# Patient Record
Sex: Male | Born: 2005 | Race: White | Hispanic: No | Marital: Single | Smoking: Never smoker
Health system: Southern US, Community
[De-identification: ages and names within clinical notes are randomized; demographics above are authoritative.]

## PROBLEM LIST (undated history)

## (undated) DIAGNOSIS — F913 Oppositional defiant disorder: Secondary | ICD-10-CM

## (undated) DIAGNOSIS — R45851 Suicidal ideations: Secondary | ICD-10-CM

## (undated) DIAGNOSIS — F39 Unspecified mood [affective] disorder: Secondary | ICD-10-CM

## (undated) DIAGNOSIS — F819 Developmental disorder of scholastic skills, unspecified: Secondary | ICD-10-CM

## (undated) DIAGNOSIS — J45909 Unspecified asthma, uncomplicated: Secondary | ICD-10-CM

## (undated) DIAGNOSIS — F909 Attention-deficit hyperactivity disorder, unspecified type: Secondary | ICD-10-CM

## (undated) DIAGNOSIS — G4700 Insomnia, unspecified: Secondary | ICD-10-CM

## (undated) DIAGNOSIS — F332 Major depressive disorder, recurrent severe without psychotic features: Secondary | ICD-10-CM

## (undated) HISTORY — PX: COSMETIC SURGERY: SHX468

---
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
## 2023-08-02 ENCOUNTER — Ambulatory Visit: Payer: Medicaid Other | Admitting: Nurse Practitioner

## 2023-09-08 ENCOUNTER — Ambulatory Visit: Payer: Medicaid Other | Admitting: Nurse Practitioner

## 2023-10-17 ENCOUNTER — Other Ambulatory Visit: Payer: Self-pay

## 2023-10-17 ENCOUNTER — Encounter (HOSPITAL_COMMUNITY): Payer: Self-pay

## 2023-10-17 ENCOUNTER — Emergency Department (HOSPITAL_COMMUNITY)
Admission: EM | Admit: 2023-10-17 | Discharge: 2023-10-17 | Disposition: A | Discharge status: Home / Self Care | Attending: Emergency Medicine | Admitting: Emergency Medicine

## 2023-10-17 ENCOUNTER — Emergency Department (HOSPITAL_COMMUNITY)

## 2023-10-17 DIAGNOSIS — D72829 Elevated white blood cell count, unspecified: Secondary | ICD-10-CM | POA: Insufficient documentation

## 2023-10-17 DIAGNOSIS — R1031 Right lower quadrant pain: Secondary | ICD-10-CM | POA: Insufficient documentation

## 2023-10-17 DIAGNOSIS — J45909 Unspecified asthma, uncomplicated: Secondary | ICD-10-CM | POA: Diagnosis not present

## 2023-10-17 LAB — CBC
HCT: 50 % (ref 39.0–52.0)
Hemoglobin: 17.1 g/dL — ABNORMAL HIGH (ref 13.0–17.0)
MCH: 30.7 pg (ref 26.0–34.0)
MCHC: 34.2 g/dL (ref 30.0–36.0)
MCV: 89.8 fL (ref 80.0–100.0)
Platelets: 346 10*3/uL (ref 150–400)
RBC: 5.57 MIL/uL (ref 4.22–5.81)
RDW: 12.2 % (ref 11.5–15.5)
WBC: 17.4 10*3/uL — ABNORMAL HIGH (ref 4.0–10.5)
nRBC: 0 % (ref 0.0–0.2)

## 2023-10-17 LAB — COMPREHENSIVE METABOLIC PANEL WITH GFR
ALT: 29 U/L (ref 0–44)
AST: 25 U/L (ref 15–41)
Albumin: 4.6 g/dL (ref 3.5–5.0)
Alkaline Phosphatase: 55 U/L (ref 38–126)
Anion gap: 10 (ref 5–15)
BUN: 9 mg/dL (ref 6–20)
CO2: 24 mmol/L (ref 22–32)
Calcium: 9.6 mg/dL (ref 8.9–10.3)
Chloride: 103 mmol/L (ref 98–111)
Creatinine, Ser: 0.92 mg/dL (ref 0.61–1.24)
GFR, Estimated: >60 mL/min (ref >60)
Glucose, Bld: 115 mg/dL — ABNORMAL HIGH (ref 70–99)
Potassium: 4.2 mmol/L (ref 3.5–5.1)
Sodium: 137 mmol/L (ref 135–145)
Total Bilirubin: 1.1 mg/dL (ref 0.0–1.2)
Total Protein: 8.1 g/dL (ref 6.5–8.1)

## 2023-10-17 LAB — URINALYSIS, ROUTINE W REFLEX MICROSCOPIC
Bilirubin Urine: NEGATIVE
Glucose, UA: NEGATIVE mg/dL
Hgb urine dipstick: NEGATIVE
Ketones, ur: NEGATIVE mg/dL
Leukocytes,Ua: NEGATIVE
Nitrite: NEGATIVE
Protein, ur: NEGATIVE mg/dL
Specific Gravity, Urine: 1.009 (ref 1.005–1.030)
pH: 6 (ref 5.0–8.0)

## 2023-10-17 LAB — LIPASE, BLOOD: Lipase: 28 U/L (ref 11–51)

## 2023-10-17 MED ORDER — MORPHINE SULFATE (PF) 4 MG/ML IV SOLN
4.0000 mg | Freq: Once | INTRAVENOUS | Status: AC
  Administered 2023-10-17: 4 mg via INTRAVENOUS
  Filled 2023-10-17: qty 1

## 2023-10-17 MED ORDER — ONDANSETRON HCL 4 MG/2ML IJ SOLN: 4.0000 mg | Freq: Four times a day (QID) | INTRAMUSCULAR | Status: DC | PRN

## 2023-10-17 MED ORDER — IOHEXOL 350 MG/ML SOLN
75.0000 mL | Freq: Once | INTRAVENOUS | Status: AC | PRN
  Administered 2023-10-17: 75 mL via INTRAVENOUS

## 2023-10-17 MED ORDER — SODIUM CHLORIDE 0.9 % IV BOLUS
1000.0000 mL | Freq: Once | INTRAVENOUS | Status: AC
  Administered 2023-10-17: 1000 mL via INTRAVENOUS

## 2023-10-17 NOTE — Discharge Instructions (Signed)
 It was a pleasure caring for you today.  Please follow-up with your primary care provider.  Seek emergency care experiencing any new or worsening symptoms.

## 2023-10-17 NOTE — ED Notes (Signed)
 Patient transported to CT

## 2023-10-17 NOTE — ED Triage Notes (Signed)
 Pt c/o RLQ abdominal pain and nauseax2d.

## 2023-10-17 NOTE — ED Provider Notes (Addendum)
 [place] EMERGENCY DEPARTMENT AT [facility] Provider Note   CSN: [id number] Arrival date & time: 10/17/23  1703     History  Chief Complaint  Patient presents with   Abdominal Pain    Edward Fox is a 18 y.o. male with PMHx asthma who presents to ED concerned for nausea and RLQ pain x2 days. Patient also stating that he has a hx of low back pain d/t his job and heavy lifting and has had low back pain x2 weeks as well. Patient went to UC today who provided him with Advil and steroid shot and believed that the symptoms may have been d/t constipation. Patient stating that his back pain feels better now, but his RLQ pain persists. Last BM today.  Denies fever, vomiting, diarrhea, dysuria, hematuria, hematochezia.    Abdominal Pain      Home Medications Prior to Admission medications   Medication Sig Start Date End Date Taking? Authorizing Provider  albuterol (PROVENTIL HFA;VENTOLIN HFA) 108 (90 Base) MCG/ACT inhaler Inhale 2 puffs into the lungs every 6 (six) hours as needed for wheezing or shortness of breath. 07/29/15   Saez-Benito, Phillips Climes, MD  Amphetamine ER (ADZENYS XR-ODT) 18.8 MG TBED Take 1 tablet by mouth every morning. 02/24/16   [provider]  Amphetamine ER (ADZENYS XR-ODT) 6.3 MG TBED Take 1 tablet by mouth every morning. 02/24/16   [provider]  HYDROcodone-acetaminophen (NORCO/VICODIN) 5-325 MG tablet Take by mouth. 03/05/16   [provider]      Allergies    Patient has no known allergies.    Review of Systems   Review of Systems  Gastrointestinal:  Positive for abdominal pain.    Physical Exam Updated Vital Signs BP (!) 117/54   Pulse 68   Temp 98.3 F (36.8 C)   Resp 17   SpO2 99%  Physical Exam Vitals and nursing note reviewed.  Constitutional:      General: He is not in acute distress.    Appearance: He is not ill-appearing or toxic-appearing.  HENT:     Head: Normocephalic and atraumatic.      Mouth/Throat:     Mouth: Mucous membranes are moist.     Pharynx: No posterior oropharyngeal erythema.  Eyes:     General: No scleral icterus.       Right eye: No discharge.        Left eye: No discharge.     Conjunctiva/sclera: Conjunctivae normal.  Cardiovascular:     Rate and Rhythm: Normal rate and regular rhythm.     Pulses: Normal pulses.     Heart sounds: Normal heart sounds. No murmur heard. Pulmonary:     Effort: Pulmonary effort is normal. No respiratory distress.     Breath sounds: Normal breath sounds. No wheezing, rhonchi or rales.  Abdominal:     General: Abdomen is flat. Bowel sounds are normal. There is no distension.     Palpations: Abdomen is soft. There is no mass.     Tenderness: There is abdominal tenderness in the right lower quadrant.  Musculoskeletal:     Right lower leg: No edema.     Left lower leg: No edema.  Skin:    General: Skin is warm and dry.     Findings: No rash.  Neurological:     General: No focal deficit present.     Mental Status: He is alert and oriented to person, place, and time. Mental status is at baseline.  Psychiatric:  Mood and Affect: Mood normal.     ED Results / Procedures / Treatments   Labs (all labs ordered are listed, but only abnormal results are displayed) Labs Reviewed  COMPREHENSIVE METABOLIC PANEL WITH GFR - Abnormal; Notable for the following components:      Result Value   Glucose, Bld 115 (*)    All other components within normal limits  CBC - Abnormal; Notable for the following components:   WBC 17.4 (*)    Hemoglobin 17.1 (*)    All other components within normal limits  LIPASE, BLOOD  URINALYSIS, ROUTINE W REFLEX MICROSCOPIC    EKG None  Radiology CT ABDOMEN PELVIS W CONTRAST Result Date: 10/17/2023 CLINICAL DATA:  Right lower quadrant abdominal pain. EXAM: CT ABDOMEN AND PELVIS WITH CONTRAST TECHNIQUE: Multidetector CT imaging of the abdomen and pelvis was performed using the standard protocol  following bolus administration of intravenous contrast. RADIATION DOSE REDUCTION: This exam was performed according to the departmental dose-optimization program which includes automated exposure control, adjustment of the mA and/or kV according to patient size and/or use of iterative reconstruction technique. CONTRAST:  75mL OMNIPAQUE IOHEXOL 350 MG/ML SOLN COMPARISON:  None Available. FINDINGS: Lower chest: The lung bases are clear of acute process. No pleural effusion or pulmonary lesions. The heart is normal in size. No pericardial effusion. The distal esophagus and aorta are unremarkable. Hepatobiliary: No focal hepatic lesions or intrahepatic biliary dilatation. The gallbladder is normal. No common bile duct dilatation. Pancreas: Unremarkable. No pancreatic ductal dilatation or surrounding inflammatory changes. Spleen: Normal in size without focal abnormality. Adrenals/Urinary Tract: Adrenal glands and kidneys are unremarkable. No renal calculi, renal lesions or findings for pyelonephritis. The bladder is unremarkable. Stomach/Bowel: Stomach, duodenum, small bowel and colon are grossly normal without oral contrast. The colon is largely fluid-filled which is typically seen with diarrheal disease is. No wall thickening or obvious pericolonic inflammatory changes. The appendix is normal. Vascular/Lymphatic: No significant vascular findings are present. No enlarged abdominal or pelvic lymph nodes. Reproductive: The prostate gland and seminal vesicles are unremarkable. Other: No pelvic mass or adenopathy. No free pelvic fluid collections. No inguinal mass or adenopathy. No abdominal wall hernia or subcutaneous lesions. Musculoskeletal: No significant bony findings. IMPRESSION: 1. No acute abdominal/pelvic findings, mass lesions or adenopathy. 2. Normal appendix. 3. The colon mildly distended and largely fluid-filled which is typically seen with diarrheal diseases. Electronically Signed   By: Rudie Meyer M.D.    On: 10/17/2023 21:57    Procedures Procedures    Medications Ordered in ED Medications  ondansetron (ZOFRAN) injection 4 mg (has no administration in time range)  sodium chloride 0.9 % bolus 1,000 mL (0 mLs Intravenous Stopped 10/17/23 2033)  morphine (PF) 4 MG/ML injection 4 mg (4 mg Intravenous Given 10/17/23 1847)  iohexol (OMNIPAQUE) 350 MG/ML injection 75 mL (75 mLs Intravenous Contrast Given 10/17/23 1924)    ED Course/ Medical Decision Making/ A&P                                 Medical Decision Making Amount and/or Complexity of Data Reviewed Labs: ordered. Radiology: ordered.  Risk Prescription drug management.    This patient presents to the ED for concern of abdominal pain, this involves an extensive number of treatment options, and is a complaint that carries with it a high risk of complications and morbidity.  The differential diagnosis includes gastroenteritis, colitis, small bowel obstruction, appendicitis, cholecystitis, pancreatitis,  nephrolithiasis, UTI, pyleonephritis, testicular torsion.   Co morbidities that complicate the patient evaluation  Asthma, chronic back pain   Additional history obtained:  No PCP listed in chart.    Problem List / ED Course / Critical interventions / Medication management  Patient presented for RLQ abdominal pain.  Physical exam with mild RLQ tenderness to palpation.  Patient also tachycardic in the low 100s which resolved with IV fluids.  No fever.  Other vital stable.  Rest of physical exam reassuring. I Ordered, and personally interpreted labs.  CBC with leukocytosis at 17.4.  No anemia.  Lipase within normal limits.  UA without concern for infection.  CMP reassuring. I ordered CT abd/pelv which was not concerning or appendicitis. It was showing possible diarrheal illness.  It appears that patient is suffering from viral illness vs muscle strain. Shared all results with patient. Answered all questions. Patient ready to go  home. Staffed with Dr. Andria Meuse. I have reviewed the patients home medicines and have made adjustments as needed Patient afebrile with stable vitals.  Provided with return precautions.  Discharged in good condition.   Social Determinants of Health:  none        Final Clinical Impression(s) / ED Diagnoses Final diagnoses:  Right lower quadrant abdominal pain    Rx / DC Orders ED Discharge Orders     None         Margarita Rana 10/17/23 2205    Anders Simmonds T, DO 10/17/23 2347

## 2023-10-30 ENCOUNTER — Ambulatory Visit: Payer: Medicaid Other | Admitting: Nurse Practitioner

## 2023-11-14 ENCOUNTER — Encounter (HOSPITAL_COMMUNITY): Payer: Self-pay | Admitting: Physician Assistant

## 2023-11-14 ENCOUNTER — Ambulatory Visit (INDEPENDENT_AMBULATORY_CARE_PROVIDER_SITE_OTHER)

## 2023-11-14 ENCOUNTER — Ambulatory Visit (HOSPITAL_COMMUNITY)
Admission: EM | Admit: 2023-11-14 | Discharge: 2023-11-14 | Disposition: A | Discharge status: Home / Self Care | Attending: Physician Assistant | Admitting: Physician Assistant

## 2023-11-14 ENCOUNTER — Encounter (HOSPITAL_COMMUNITY): Payer: Self-pay

## 2023-11-14 DIAGNOSIS — M545 Low back pain, unspecified: Secondary | ICD-10-CM | POA: Insufficient documentation

## 2023-11-14 DIAGNOSIS — R42 Dizziness and giddiness: Secondary | ICD-10-CM | POA: Diagnosis present

## 2023-11-14 DIAGNOSIS — G44209 Tension-type headache, unspecified, not intractable: Secondary | ICD-10-CM | POA: Insufficient documentation

## 2023-11-14 DIAGNOSIS — R5383 Other fatigue: Secondary | ICD-10-CM | POA: Insufficient documentation

## 2023-11-14 DIAGNOSIS — M25551 Pain in right hip: Secondary | ICD-10-CM

## 2023-11-14 DIAGNOSIS — R109 Unspecified abdominal pain: Secondary | ICD-10-CM | POA: Diagnosis present

## 2023-11-14 LAB — CBC WITH DIFFERENTIAL/PLATELET
Abs Immature Granulocytes: 0.03 10*3/uL (ref 0.00–0.07)
Basophils Absolute: 0.1 10*3/uL (ref 0.0–0.1)
Basophils Relative: 1 %
Eosinophils Absolute: 0.3 10*3/uL (ref 0.0–0.5)
Eosinophils Relative: 3 %
HCT: 42.3 % (ref 39.0–52.0)
Hemoglobin: 14.8 g/dL (ref 13.0–17.0)
Immature Granulocytes: 0 %
Lymphocytes Relative: 20 %
Lymphs Abs: 1.7 10*3/uL (ref 0.7–4.0)
MCH: 30.8 pg (ref 26.0–34.0)
MCHC: 35 g/dL (ref 30.0–36.0)
MCV: 87.9 fL (ref 80.0–100.0)
Monocytes Absolute: 1 10*3/uL (ref 0.1–1.0)
Monocytes Relative: 12 %
Neutro Abs: 5.6 10*3/uL (ref 1.7–7.7)
Neutrophils Relative %: 64 %
Platelets: 302 10*3/uL (ref 150–400)
RBC: 4.81 MIL/uL (ref 4.22–5.81)
RDW: 12 % (ref 11.5–15.5)
WBC: 8.6 10*3/uL (ref 4.0–10.5)
nRBC: 0 % (ref 0.0–0.2)

## 2023-11-14 LAB — COMPREHENSIVE METABOLIC PANEL WITH GFR
ALT: 20 U/L (ref 0–44)
AST: 23 U/L (ref 15–41)
Albumin: 4.2 g/dL (ref 3.5–5.0)
Alkaline Phosphatase: 45 U/L (ref 38–126)
Anion gap: 10 (ref 5–15)
BUN: 9 mg/dL (ref 6–20)
CO2: 26 mmol/L (ref 22–32)
Calcium: 9.3 mg/dL (ref 8.9–10.3)
Chloride: 104 mmol/L (ref 98–111)
Creatinine, Ser: 0.85 mg/dL (ref 0.61–1.24)
GFR, Estimated: >60 mL/min (ref >60)
Glucose, Bld: 82 mg/dL (ref 70–99)
Potassium: 3.8 mmol/L (ref 3.5–5.1)
Sodium: 140 mmol/L (ref 135–145)
Total Bilirubin: 0.6 mg/dL (ref 0.0–1.2)
Total Protein: 7.1 g/dL (ref 6.5–8.1)

## 2023-11-14 LAB — POCT URINALYSIS DIP (MANUAL ENTRY)
Bilirubin, UA: NEGATIVE
Blood, UA: NEGATIVE
Glucose, UA: NEGATIVE mg/dL
Ketones, POC UA: NEGATIVE mg/dL
Leukocytes, UA: NEGATIVE
Nitrite, UA: NEGATIVE
Protein Ur, POC: NEGATIVE mg/dL
Spec Grav, UA: 1.02 (ref 1.010–1.025)
Urobilinogen, UA: 0.2 U/dL
pH, UA: 6.5 (ref 5.0–8.0)

## 2023-11-14 LAB — TSH: TSH: 1.837 u[IU]/mL (ref 0.350–4.500)

## 2023-11-14 LAB — POCT FASTING CBG KUC MANUAL ENTRY: POCT Glucose (KUC): 98 mg/dL (ref 70–99)

## 2023-11-14 MED ORDER — NAPROXEN 500 MG PO TABS: 500.0000 mg | ORAL_TABLET | Freq: Two times a day (BID) | ORAL | 0 refills | Status: AC

## 2023-11-14 MED ORDER — LIDOCAINE 5 % EX PTCH: 1.0000 | MEDICATED_PATCH | CUTANEOUS | 0 refills | Status: AC

## 2023-11-14 MED ORDER — MECLIZINE HCL 25 MG PO TABS: 25.0000 mg | ORAL_TABLET | Freq: Three times a day (TID) | ORAL | 0 refills | Status: AC | PRN

## 2023-11-14 MED ORDER — BACLOFEN 10 MG PO TABS: 10.0000 mg | ORAL_TABLET | Freq: Two times a day (BID) | ORAL | 0 refills | Status: AC | PRN

## 2023-11-14 NOTE — ED Triage Notes (Signed)
 Pt c/o facial/head pressure with dizziness, rt lower back, and RLQ pain for months. States has been seen by multiple doctors and no dx. States has been tx'd with steroids and for constipation in the past with no relief.

## 2023-11-14 NOTE — Discharge Instructions (Addendum)
 I do not see anything on your x-ray but we will contact you if the radiologist sees something.  Use lidocaine  patch during the day and then remove this at night.  Use only 1 patch for 24 hours.  Take Naprosyn  twice daily.  Do not take NSAIDs with this medication including aspirin, ibuprofen/Advil, naproxen /Aleve .  You can use Tylenol /acetaminophen  for additional pain relief.  Take baclofen up to twice a day.  This will make you sleepy so do not drive or drink alcohol while taking it.  Take meclizine  for dizziness.  Follow-up with sports medicine.  Use heat and gentle stretch.  If anything worsens please return for reevaluation.  And hoping that these medications will also up with your headache.  Make sure you are drinking plenty of fluid.  Make sure that you are eating a bland diet and drinking plenty fluids to help with your abdominal pain.  I will contact you if any of your blood work is abnormal.  If anything worsens you need to be seen immediately including abdominal pain, nausea, vomiting, blood in your stool, weakness in your legs, numbness or tingling around your thighs, going to the bathroom on your cell without noticing it, constant dizziness, weakness, trouble speaking need to go to the ER.

## 2023-11-14 NOTE — ED Provider Notes (Signed)
 [facility]    CSN: [id number] Arrival date & time: 11/14/23  1436      History   Chief Complaint Chief Complaint  Patient presents with   Dizziness    HPI Edward Fox is a 18 y.o. male.   Patient presents today with several concerns.  He reports a prolonged history (many months) of right lower back pain.  He reports that several months ago he had an episode where he had such severe pain that interfered with his ability to perform daily activities.  Recently he has not been quite as severe but he continues to have pain that is rated 6 on a 0-10 pain scale, described as a sharp, localized to his right lumbar spine and sacroiliac region, no alleviating factors identified.  He denies any previous injury or surgery involving his back.  He has tried multiple medications and been evaluated by multiple providers but all of these interventions have been ineffective.  He has not seen a specialist and has not yet follow-up with primary care.  He has been taking over-the-counter analgesics without improvement.  Denies any numbness or paresthesias in his leg, lower extremities, saddle anesthesia.  In addition he reports a prolonged history of lower abdominal discomfort that is intermittent in nature without identifiable trigger.  He has been worked up in the past and had negative CT scan.  Was told that it was constipation but he has been taking medication for this without improvement of symptoms.  He reports having 1-2 normal bowel movements per day.  Denies any associated melena, hematochezia.  Denies any dietary changes.  He has not tried any over-the-counter medication.  He has never seen a specialist denies any history of ulcerative colitis or Crohn's disease.  He also reports intermittent headaches that he describes as a band around his head.  Denies any significant congestion or cough.  He sometimes will have pain in his neck.  Denies formal diagnosis of primary headache disorder.  Denies  any recent head trauma.  Denies any recent lifestyle or dietary changes.  He denies any associated visual disturbance, dysarthria, nausea, vomiting.  He does report some dizziness that he describes as room spinning sensation and blurred vision particularly with moving his head.  He has been evaluated for this in the past and had negative workup including normal CBC and CMP.  He was given meclizine  but does not feel that this improved his symptoms.    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   Mood disorder (HCC)    ODD (oppositional defiant disorder) 07/22/2015   Suicidal ideation 07/22/2015    Patient Active Problem List   Diagnosis Date Noted   Severe episode of recurrent major depressive disorder, with psychotic features (HCC)    Attention deficit hyperactivity disorder (ADHD), combined type    Severe episode of recurrent major depressive disorder, without psychotic features (HCC)    Attention-deficit hyperactivity disorder, predominantly hyperactive type    MDD (major depressive disorder), recurrent episode, severe (HCC) 08/17/2015   Attention deficit hyperactivity disorder (ADHD) 07/22/2015   MDD (major depressive disorder), recurrent severe, 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 Authorizing Provider  baclofen (LIORESAL) 10 MG tablet Take 1  tablet (10 mg total) by mouth 2 (two) times daily as needed for muscle spasms. 11/14/23  Yes Justene Jensen K, PA-C  lidocaine  (LIDODERM ) 5 % Place 1 patch onto the skin daily. Remove & Discard patch within 12 hours or as directed by MD 11/14/23  Yes Marley Pakula K, PA-C  meclizine  (ANTIVERT ) 25 MG tablet Take 1 tablet (25 mg total) by mouth 3 (three) times daily as needed for dizziness. 11/14/23  Yes Elgin Carn, Betsey Brow, PA-C  naproxen  (NAPROSYN ) 500 MG tablet Take 1 tablet (500 mg total) by mouth 2 (two) times daily. 11/14/23  Yes Osie Merkin K, PA-C  albuterol  (PROVENTIL  HFA;VENTOLIN  HFA) 108 (90 Base) MCG/ACT inhaler Inhale 2 puffs into the lungs every 6 (six) hours as needed for wheezing or shortness of breath. 07/29/15   Saez-Benito, Sherian Dimitri, MD  Amphetamine ER (ADZENYS XR-ODT) 18.8 MG TBED Take 1 tablet by mouth every morning. 02/24/16   [provider]  Amphetamine ER (ADZENYS XR-ODT) 6.3 MG TBED Take 1 tablet by mouth every morning. 02/24/16   [provider]    Family History History reviewed. No pertinent family history.  Social History Social History   Tobacco Use   Smoking status: Never   Smokeless tobacco: Never  Substance Use Topics   Alcohol use: No   Drug use: No     Allergies   Patient has no known allergies.   Review of Systems Review of Systems  Constitutional:  Positive for activity change and fatigue. Negative for appetite change and fever.  HENT:  Negative for congestion, sinus pressure, sneezing and sore throat.   Respiratory:  Negative for cough and shortness of breath.   Cardiovascular:  Negative for chest pain.  Gastrointestinal:  Positive for abdominal pain. Negative for diarrhea, nausea and vomiting.  Musculoskeletal:  Positive for arthralgias, back pain and neck pain. Negative for myalgias.  Neurological:  Positive for dizziness and headaches. Negative for light-headedness.     Physical Exam Triage Vital Signs ED Triage Vitals   Encounter Vitals Group     BP 11/14/23 1506 (!) 144/75     Systolic BP Percentile --      Diastolic BP Percentile --      Pulse Rate 11/14/23 1506 81     Resp 11/14/23 1506 16     Temp 11/14/23 1506 98.2 F (36.8 C)     Temp Source 11/14/23 1506 Oral     SpO2 11/14/23 1506 98 %     Weight --      Height --      Head Circumference --      Peak Flow --      Pain Score 11/14/23 1508 6     Pain Loc --      Pain Education --      Exclude from Growth Chart --    No data found.  Updated Vital Signs BP (!) 144/75 (BP Location: Left Arm)   Pulse 81   Temp 98.2 F (36.8 C) (Oral)   Resp 16   SpO2 98%   Visual Acuity Right Eye Distance:   Left Eye Distance:   Bilateral Distance:  Right Eye Near:   Left Eye Near:    Bilateral Near:     Physical Exam Vitals reviewed.  Constitutional:      General: He is awake.     Appearance: Normal appearance. He is well-developed. He is not ill-appearing.     Comments: Very pleasant male appears stated age in no acute distress sitting comfortably in exam room  HENT:     Head: Normocephalic and atraumatic.     Right Ear: Tympanic membrane, ear canal and external ear normal. Tympanic membrane is not erythematous or bulging.     Left Ear: Tympanic membrane, ear canal and external ear normal. Tympanic membrane is not erythematous or bulging.     Nose: Nose normal.     Mouth/Throat:     Pharynx: Uvula midline. Posterior oropharyngeal erythema present. No oropharyngeal exudate or uvula swelling.  Eyes:     Extraocular Movements: Extraocular movements intact.     Pupils: Pupils are equal, round, and reactive to light.  Cardiovascular:     Rate and Rhythm: Normal rate and regular rhythm.     Heart sounds: Normal heart sounds, S1 normal and S2 normal. No murmur heard. Pulmonary:     Effort: Pulmonary effort is normal. No accessory muscle usage or respiratory distress.     Breath sounds: Normal breath sounds. No stridor. No wheezing, rhonchi  or rales.     Comments: Clear auscultation bilaterally Abdominal:     General: Bowel sounds are normal.     Palpations: Abdomen is soft.     Tenderness: There is no abdominal tenderness. There is no right CVA tenderness, left CVA tenderness, guarding or rebound.     Comments: Benign abdominal exam.  No tenderness palpation on exam.  Musculoskeletal:     Cervical back: Normal range of motion and neck supple. No tenderness or bony tenderness.     Thoracic back: No tenderness or bony tenderness.     Lumbar back: Tenderness and bony tenderness present. No spasms. Negative right straight leg raise test and negative left straight leg raise test.     Comments: Back: Pain on percussion of lumbar vertebrae.  Tenderness palpation over SI joint.  Positive Faber on right.  Negative straight leg raise bilaterally.  Neurological:     General: No focal deficit present.     Mental Status: He is alert and oriented to person, place, and time.     Cranial Nerves: Cranial nerves 2-12 are intact.     Motor: Motor function is intact.     Coordination: Coordination is intact.  Psychiatric:        Behavior: Behavior is cooperative.      UC Treatments / Results  Labs (all labs ordered are listed, but only abnormal results are displayed) Labs Reviewed  POCT FASTING CBG [facility] MANUAL ENTRY - Normal  CBC WITH DIFFERENTIAL/PLATELET  COMPREHENSIVE METABOLIC PANEL WITH GFR  TSH  POCT URINALYSIS DIP (MANUAL ENTRY)    EKG   Radiology DG Hip Unilat With Pelvis 2-3 Views Right Result Date: 11/14/2023 CLINICAL DATA:  Hip pain. EXAM: DG HIP (WITH OR WITHOUT PELVIS) 2-3V RIGHT COMPARISON:  CT abdomen and pelvis 10/17/2023 FINDINGS: Normal bone mineralization. The bilateral sacroiliac, bilateral femoroacetabular, and pubic symphysis joint spaces are maintained. Normal morphology of the right femoral head-neck junction without CAM-type bump deformity. No acute fracture or dislocation. IMPRESSION: Normal right hip  radiographs. Electronically Signed   By: Bertina Broccoli M.D.   On: 11/14/2023 17:04   DG Lumbar Spine  Complete Result Date: 11/14/2023 CLINICAL DATA:  Facial/head pressure with dizziness. Right lower back pain. Right lower quadrant pain for months. EXAM: LUMBAR SPINE - COMPLETE 4+ VIEW COMPARISON:  CT abdomen and pelvis 10/17/2023 FINDINGS: The first non rib-bearing vertebral bodies considered L1. Normal frontal alignment. There is 3 mm retrolisthesis of L5 on S1, unchanged from 10/17/2023 CT. Vertebral body heights are maintained. Disc spaces are preserved. Mild to moderate L5-S1 facet joint sclerosis and hypertrophy. IMPRESSION: 1. 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 2. There is 3 mm retrolisthesis of L5 on S1, unchanged from 10/17/2023 CT. Note is made on 10/17/2023 CT of a left paracentral posterior disc protrusion at L5-S1 that contacts the descending left S1 nerve. Electronically Signed   By: Bertina Broccoli M.D.   On: 11/14/2023 17:02    Procedures Procedures (including critical care time)  Medications Ordered in UC Medications - No data to display  Initial Impression / Assessment and Plan / UC Course  I have reviewed the triage vital signs and the nursing notes.  Pertinent labs & imaging results that were available during my care of the patient were reviewed by me and considered in my medical decision making (see chart for details).     Patient is afebrile, nontoxic, nontachycardic.  X-rays of lumbar spine and hip were obtained that showed degenerative changes without acute osseous abnormality.  He was started on baclofen as well as lidocaine  patches for symptomatic relief.  We discussed that baclofen can be sedating and he is not to drive or drink alcohol with taking it.  He is to apply lidocaine  patch during the day and then remove this at night.  He can use over-the-counter analgesics such as Tylenol  for pain relief and was also started on Naprosyn .  We discussed that he is  not to take additional NSAIDs with this medication and risk of GI bleeding.  He is to avoid strenuous activity including heavy lifting.  Discussed that he should follow-up with the sports medicine specialist and was given the contact information for local provider with instruction to call to schedule an appointment.  Vital signs and physical exam are reassuring with no indication for emergent evaluation or imaging.  He is not going experiencing any abdominal pain.  He was encouraged to eat a bland diet and drink plenty of fluid.  Discussed that if he develops any severe symptoms including fever, focal abdominal pain, changes in his bowel habits, nausea/vomiting, melena or hematochezia he needs to be seen emergently.  Ongoing headaches consistent with primary headache disorder of tension headaches.  We discussed that medication prescribed to help with his musculoskeletal back pain should help to manage the symptoms as well.  He was encouraged to push fluids.  We did discuss that it is possible he can have rebound headaches and so should avoid regular use of analgesics as this could cause worsening of symptoms.  Discussed the importance of follow-up with primary care and he does attempt to schedule an appointment with them.  We discussed that if he has any worsening symptoms including severe headache, worst headache of his life, weakness he needs to be seen immediately.  Dizziness most consistent with vertigo.  He was provided a prescription for meclizine  to help manage the symptoms and also encouraged to make sure he is pushing fluids.  His blood sugar was appropriate.  Discussed that is important he follows up with primary care as they can refer him to a specialist if his symptoms persist for further  evaluation and management.  Discussed that if anything worsens he should go to the ER.  Patient reports ongoing fatigue and malaise.  His random glucose was normal.  UA was normal.  CBC, CMP, thyroid were  normal.  Unclear etiology of symptoms but he was encouraged to rest and follow appropriate sleep hygiene practices.  Recommended follow-up with primary care for additional evaluation.  Final Clinical Impressions(s) / UC Diagnoses   Final diagnoses:  Acute right-sided low back pain without sciatica  Right hip pain  Intermittent abdominal pain  Tension headache  Fatigue, unspecified type  Dizziness     Discharge Instructions      I do not see anything on your x-ray but we will contact you if the radiologist sees something.  Use lidocaine  patch during the day and then remove this at night.  Use only 1 patch for 24 hours.  Take Naprosyn  twice daily.  Do not take NSAIDs with this medication including aspirin, ibuprofen/Advil, naproxen /Aleve .  You can use Tylenol /acetaminophen  for additional pain relief.  Take baclofen up to twice a day.  This will make you sleepy so do not drive or drink alcohol while taking it.  Take meclizine  for dizziness.  Follow-up with sports medicine.  Use heat and gentle stretch.  If anything worsens please return for reevaluation.  And hoping that these medications will also up with your headache.  Make sure you are drinking plenty of fluid.  Make sure that you are eating a bland diet and drinking plenty fluids to help with your abdominal pain.  I will contact you if any of your blood work is abnormal.  If anything worsens you need to be seen immediately including abdominal pain, nausea, vomiting, blood in your stool, weakness in your legs, numbness or tingling around your thighs, going to the bathroom on your cell without noticing it, constant dizziness, weakness, trouble speaking need to go to the ER.     ED Prescriptions     Medication Sig Dispense Auth. Provider   baclofen (LIORESAL) 10 MG tablet Take 1 tablet (10 mg total) by mouth 2 (two) times daily as needed for muscle spasms. 30 each Linet Brash K, PA-C   lidocaine  (LIDODERM ) 5 % Place 1 patch onto the skin  daily. Remove & Discard patch within 12 hours or as directed by MD 30 patch Ivry Pigue K, PA-C   naproxen  (NAPROSYN ) 500 MG tablet Take 1 tablet (500 mg total) by mouth 2 (two) times daily. 30 tablet Salihah Peckham K, PA-C   meclizine  (ANTIVERT ) 25 MG tablet Take 1 tablet (25 mg total) by mouth 3 (three) times daily as needed for dizziness. 30 tablet Yael Coppess K, PA-C      PDMP not reviewed this encounter.   Budd Cargo, PA-C 11/14/23 2201

## 2024-01-17 ENCOUNTER — Ambulatory Visit (HOSPITAL_COMMUNITY)
Admission: EM | Admit: 2024-01-17 | Discharge: 2024-01-17 | Disposition: A | Discharge status: Home / Self Care | Attending: Internal Medicine | Admitting: Internal Medicine

## 2024-01-17 ENCOUNTER — Encounter (HOSPITAL_COMMUNITY): Payer: Self-pay

## 2024-01-17 DIAGNOSIS — K4090 Unilateral inguinal hernia, without obstruction or gangrene, not specified as recurrent: Secondary | ICD-10-CM

## 2024-01-17 LAB — POCT URINALYSIS DIP (MANUAL ENTRY)
Bilirubin, UA: NEGATIVE
Blood, UA: NEGATIVE
Glucose, UA: NEGATIVE mg/dL
Ketones, POC UA: NEGATIVE mg/dL
Leukocytes, UA: NEGATIVE
Nitrite, UA: NEGATIVE
Protein Ur, POC: NEGATIVE mg/dL
Spec Grav, UA: 1.025 (ref 1.010–1.025)
Urobilinogen, UA: 1 U/dL
pH, UA: 5.5 (ref 5.0–8.0)

## 2024-01-17 NOTE — ED Triage Notes (Signed)
 Lower abdominal pain started yesterday. Patient does lift heavy equipment. Patient denies any vomiting or diarrhea at this time.

## 2024-01-17 NOTE — ED Provider Notes (Signed)
 [facility]    CSN: [id number] Arrival date & time: 01/17/24  0803      History   Chief Complaint No chief complaint on file.   HPI Edward Fox is a 18 y.o. male.   Edward Fox is a 18 y.o. male presenting for chief complaint of generalized abdominal pain that started yesterday. Pain is localized to the generalized mid abdomen and intermittently radiates to the suprapubic region. He does not complain of pain on one side of the abdomen or the other, states it is right in the middle.  Last normal bowel movement was this morning.  Denies blood or mucus in the stools.  Denies nausea, vomiting, urinary symptoms, fever, chills, recent abdominal strain/new/increased abdominal exercise or activity, and history of surgeries to the abdomen.  His job does require him to use his abdominal muscles frequently as he works in a [employer] and his job is physically demanding.  He wonders if he has an umbilical hernia.  He has been checked for inguinal hernias in the past with unremarkable findings. He has not attempted use of any OTC medications for symptoms PTA.      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   Mood disorder (HCC)    ODD (oppositional defiant disorder) 07/22/2015   Suicidal ideation 07/22/2015    Patient Active Problem List   Diagnosis Date Noted   Severe episode of recurrent major depressive disorder, with psychotic features (HCC)    Attention deficit hyperactivity disorder (ADHD), combined type    Severe episode of recurrent major depressive disorder, without psychotic features (HCC)    Attention-deficit hyperactivity disorder, predominantly hyperactive type    MDD (major depressive disorder), recurrent episode,  severe (HCC) 08/17/2015   Attention deficit hyperactivity disorder (ADHD) 07/22/2015   MDD (major depressive disorder), recurrent severe, 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 Authorizing Provider  albuterol  (PROVENTIL  HFA;VENTOLIN  HFA) 108 (90 Base) MCG/ACT inhaler Inhale 2 puffs into the lungs every 6 (six) hours as needed for wheezing or shortness of breath. 07/29/15   Saez-Benito, Eva CHRISTELLA Rav, MD  Amphetamine ER (ADZENYS XR-ODT) 18.8 MG TBED Take 1 tablet by mouth every morning. 02/24/16   [provider]  Amphetamine ER (ADZENYS XR-ODT) 6.3 MG TBED Take 1 tablet by mouth every morning. 02/24/16   [provider]  baclofen  (LIORESAL ) 10 MG tablet Take 1 tablet (10 mg total) by mouth 2 (two) times daily as needed for muscle spasms. 11/14/23   Raspet, Erin K, PA-C  lidocaine  (LIDODERM ) 5 % Place 1 patch onto the skin daily. Remove & Discard patch within 12 hours or as directed by MD 11/14/23   Raspet, Rocky POUR, PA-C  meclizine  (ANTIVERT ) 25 MG tablet Take 1 tablet (25 mg total) by mouth 3 (three) times daily as needed for dizziness. 11/14/23   Raspet, Erin K, PA-C  naproxen  (NAPROSYN )  500 MG tablet Take 1 tablet (500 mg total) by mouth 2 (two) times daily. 11/14/23   Raspet, Rocky POUR, PA-C    Family History History reviewed. No pertinent family history.  Social History Social History   Tobacco Use   Smoking status: Never   Smokeless tobacco: Never  Substance Use Topics   Alcohol use: No   Drug use: No     Allergies   Patient has no known allergies.   Review of Systems Review of  Systems Per HPI  Physical Exam Triage Vital Signs ED Triage Vitals  Encounter Vitals Group     BP 01/17/24 0829 136/80     Girls Systolic BP Percentile --      Girls Diastolic BP Percentile --      Boys Systolic BP Percentile --      Boys Diastolic BP Percentile --      Pulse Rate 01/17/24 0829 75     Resp 01/17/24 0829 18     Temp 01/17/24 0829 98.5 F (36.9 C)     Temp Source 01/17/24 0829 Oral     SpO2 01/17/24 0829 97 %     Weight --      Height --      Head Circumference --      Peak Flow --      Pain Score 01/17/24 0831 5     Pain Loc --      Pain Education --      Exclude from Growth Chart --    No data found.  Updated Vital Signs BP 136/80 (BP Location: Left Arm)   Pulse 75   Temp 98.5 F (36.9 C) (Oral)   Resp 18   SpO2 97%   Visual Acuity Right Eye Distance:   Left Eye Distance:   Bilateral Distance:    Right Eye Near:   Left Eye Near:    Bilateral Near:     Physical Exam Vitals and nursing note reviewed.  Constitutional:      Appearance: He is not ill-appearing or toxic-appearing.  HENT:     Head: Normocephalic and atraumatic.     Right Ear: Hearing and external ear normal.     Left Ear: Hearing and external ear normal.     Nose: Nose normal.     Mouth/Throat:     Lips: Pink.  Eyes:     General: Lids are normal. Vision grossly intact. Gaze aligned appropriately.     Extraocular Movements: Extraocular movements intact.     Conjunctiva/sclera: Conjunctivae normal.  Pulmonary:     Effort: Pulmonary effort is normal.  Abdominal:     General: Abdomen is flat. Bowel sounds are normal.     Palpations: Abdomen is soft.     Tenderness: There is abdominal tenderness in the suprapubic area and left lower quadrant. There is no right CVA tenderness, left CVA tenderness, guarding or rebound. Negative signs include Murphy's sign and McBurney's sign.     Hernia: A hernia is present. Hernia is present in the left inguinal area. There is no hernia in the  umbilical area.     Comments: Dee, CMA present for GU exam.  Left inguinal hernia present on exam.  Musculoskeletal:     Cervical back: Neck supple.  Skin:    General: Skin is warm and dry.     Capillary Refill: Capillary refill takes less than 2 seconds.     Findings: No rash.  Neurological:     General: No focal deficit  present.     Mental Status: He is alert and oriented to person, place, and time. Mental status is at baseline.     Cranial Nerves: No dysarthria or facial asymmetry.  Psychiatric:        Mood and Affect: Mood normal.        Speech: Speech normal.        Behavior: Behavior normal.        Thought Content: Thought content normal.        Judgment: Judgment normal.      UC Treatments / Results  Labs (all labs ordered are listed, but only abnormal results are displayed) Labs Reviewed  POCT URINALYSIS DIP (MANUAL ENTRY)    EKG   Radiology No results found.  Procedures Procedures (including critical care time)  Medications Ordered in UC Medications - No data to display  Initial Impression / Assessment and Plan / UC Course  I have reviewed the triage vital signs and the nursing notes.  Pertinent labs & imaging results that were available during my care of the patient were reviewed by me and considered in my medical decision making (see chart for details).   1.  Nonrecurrent unilateral inguinal hernia without obstruction or gangrene Left inguinal hernia present on exam.  No signs of umbilical hernia. No signs of incarceration/obstruction. I would like for him to follow-up with a general surgeon to discuss further management of inguinal hernia.  In the meantime, I would like for him to take Tylenol /ibuprofen as needed for pain and inflammation. Advised to avoid heavy lifting is much as possible.  Work note given. Red flag signs/symptoms and ER return precautions discussed.  Counseled patient on potential for adverse effects with medications  prescribed/recommended today, strict ER and return-to-clinic precautions discussed, patient verbalized understanding.    Final Clinical Impressions(s) / UC Diagnoses   Final diagnoses:  Non-recurrent unilateral inguinal hernia without obstruction or gangrene     Discharge Instructions      Avoid heavy lifting as much as possible.  Please schedule an appointment with the general surgeon listed on your paperwork for follow-up. Return to clinic if you develop nausea, vomiting, diarrhea, blood/mucous in the stools, severe pain, fever/chills.  If you develop any new or worsening symptoms or if your symptoms do not start to improve, please return here or follow-up with your primary care provider. If your symptoms are severe, please go to the emergency room.     ED Prescriptions   None    PDMP not reviewed this encounter.   Enedelia Going [place], [place] 01/17/24 [phone]

## 2024-01-17 NOTE — Discharge Instructions (Signed)
 Avoid heavy lifting as much as possible.  Please schedule an appointment with the general surgeon listed on your paperwork for follow-up. Return to clinic if you develop nausea, vomiting, diarrhea, blood/mucous in the stools, severe pain, fever/chills.  If you develop any new or worsening symptoms or if your symptoms do not start to improve, please return here or follow-up with your primary care provider. If your symptoms are severe, please go to the emergency room.
